# Patient Record
Sex: Female | Born: 1955 | Race: White | Hispanic: No | Smoking: Former smoker
Health system: Southern US, Community
[De-identification: ages and names within clinical notes are randomized; demographics above are authoritative.]

## PROBLEM LIST (undated history)

## (undated) DIAGNOSIS — M4802 Spinal stenosis, cervical region: Secondary | ICD-10-CM

## (undated) DIAGNOSIS — T7840XA Allergy, unspecified, initial encounter: Secondary | ICD-10-CM

## (undated) DIAGNOSIS — E039 Hypothyroidism, unspecified: Secondary | ICD-10-CM

## (undated) DIAGNOSIS — K219 Gastro-esophageal reflux disease without esophagitis: Secondary | ICD-10-CM

## (undated) DIAGNOSIS — M1990 Unspecified osteoarthritis, unspecified site: Secondary | ICD-10-CM

## (undated) HISTORY — DX: Allergy, unspecified, initial encounter: T78.40XA

## (undated) HISTORY — DX: Hypothyroidism, unspecified: E03.9

## (undated) HISTORY — DX: Spinal stenosis, cervical region: M48.02

## (undated) HISTORY — DX: Unspecified osteoarthritis, unspecified site: M19.90

## (undated) HISTORY — DX: Gastro-esophageal reflux disease without esophagitis: K21.9

---

## 2001-01-09 HISTORY — PX: OTHER SURGICAL HISTORY: SHX169

## 2001-01-09 HISTORY — PX: BUNIONECTOMY: SHX129

## 2006-07-17 ENCOUNTER — Encounter: Admission: RE | Admit: 2006-07-17 | Discharge: 2006-07-17 | Payer: Self-pay | Admitting: Allergy

## 2009-08-07 ENCOUNTER — Encounter: Admission: RE | Admit: 2009-08-07 | Discharge: 2009-08-07 | Payer: Self-pay | Admitting: Cardiovascular Disease

## 2010-10-05 ENCOUNTER — Encounter: Payer: Self-pay | Admitting: Internal Medicine

## 2010-10-13 ENCOUNTER — Encounter: Payer: Self-pay | Admitting: Internal Medicine

## 2010-10-13 ENCOUNTER — Ambulatory Visit (AMBULATORY_SURGERY_CENTER): Payer: BC Managed Care – PPO | Admitting: *Deleted

## 2010-10-13 VITALS — Ht 64.0 in | Wt 155.8 lb

## 2010-10-13 DIAGNOSIS — Z1211 Encounter for screening for malignant neoplasm of colon: Secondary | ICD-10-CM

## 2010-10-13 MED ORDER — PEG-KCL-NACL-NASULF-NA ASC-C 100 G PO SOLR: ORAL | Status: DC

## 2010-10-27 ENCOUNTER — Ambulatory Visit (AMBULATORY_SURGERY_CENTER): Payer: BC Managed Care – PPO | Admitting: Internal Medicine

## 2010-10-27 ENCOUNTER — Encounter: Payer: Self-pay | Admitting: Internal Medicine

## 2010-10-27 VITALS — BP 131/70 | HR 65 | Temp 98.2°F | Resp 20 | Ht 64.0 in | Wt 155.0 lb

## 2010-10-27 DIAGNOSIS — K589 Irritable bowel syndrome without diarrhea: Secondary | ICD-10-CM | POA: Insufficient documentation

## 2010-10-27 DIAGNOSIS — Z1211 Encounter for screening for malignant neoplasm of colon: Secondary | ICD-10-CM

## 2010-10-27 HISTORY — PX: COLONOSCOPY: SHX174

## 2010-10-27 MED ORDER — HYOSCYAMINE SULFATE 0.125 MG SL SUBL: 0.1250 mg | SUBLINGUAL_TABLET | SUBLINGUAL | Status: AC | PRN

## 2010-10-27 MED ORDER — SODIUM CHLORIDE 0.9 % IV SOLN: 500.0000 mL | INTRAVENOUS | Status: DC

## 2010-10-27 NOTE — Patient Instructions (Addendum)
The colonoscopy was ok - mild diverticulosis only. No polyps or cancer. It does sound like you may have Irritable Bowel Syndrome. I have prescribed an anti-spasmodic called hyoscyamine to help with the urgent cramps and defecation. If this works and you need refills please ask your primary care provider to refill it.  If you have persistent problems and want help, please schedule an office visit with me. Iva Boop, MD, [facility]   Irritable Bowel Syndrome Irritable Bowel Syndrome (IBS) is caused by a disturbance of normal bowel function. Other terms used are spastic colon, mucous colitis, and irritable colon. It does not require surgery, nor does it lead to cancer. There is no cure for IBS. But with proper diet, stress reduction, and medication, you will find that your problems (symptoms) will gradually disappear or improve. IBS is a common digestive disorder. It usually appears in late adolescence or early adulthood. Women develop it twice as often as men. CAUSES   After food has been digested and absorbed in the small intestine, waste material is moved into the colon (large intestine). In the colon, water and salts are absorbed from the undigested products coming from the small intestine. The remaining residue, or fecal material, is held for elimination. Under normal circumstances, gentle, rhythmic contractions on the bowel walls push the fecal material along the colon towards the rectum. In IBS, however, these contractions are irregular and poorly coordinated. The fecal material is either retained too long, resulting in constipation, or expelled too soon, producing diarrhea. SYMPTOMS   The most common symptom of IBS is pain. It is typically in the lower left side of the belly (abdomen). But it may occur anywhere in the abdomen. It can be felt as heartburn, backache, or even as a dull pain in the arms or shoulders. The pain comes from excessive bowel-muscle spasms and from the buildup of gas and fecal  material in the colon. This pain:  Can range from sharp belly (abdominal) cramps to a dull, continuous ache.     Usually worsens soon after eating.     Is typically relieved by having a bowel movement or passing gas.  Abdominal pain is usually accompanied by constipation. But it may also produce diarrhea. The diarrhea typically occurs right after a meal or upon arising in the morning. The stools are typically soft and watery. They are often flecked with secretions (mucus). Other symptoms of IBS include:  Bloating.     Loss of appetite.     Heartburn.    Feeling sick to your stomach (nausea).     Belching    Vomiting    Gas.  IBS may also cause a number of symptoms that are unrelated to the digestive system:  Fatigue.     Headaches.    Anxiety    Shortness of breath     Difficulty in concentrating.     Dizziness.  These symptoms tend to come and go. DIAGNOSIS   The symptoms of IBS closely mimic the symptoms of other, more serious digestive disorders. So your caregiver may wish to perform a variety of additional tests to exclude these disorders. He/she wants to be certain of learning what is wrong (diagnosis). The nature and purpose of each test will be explained to you. TREATMENT A number of medications are available to help correct bowel function and/or relieve bowel spasms and abdominal pain. Among the drugs available are:  Mild, non-irritating laxatives for severe constipation and to help restore normal bowel habits.  Specific anti-diarrheal medications to treat severe or prolonged diarrhea.     Anti-spasmodic agents to relieve intestinal cramps.     Your caregiver may also decide to treat you with a mild tranquilizer or sedative during unusually stressful periods in your life.  The important thing to remember is that if any drug is prescribed for you, make sure that you take it exactly as directed. Make sure that your caregiver knows how well it worked for  you. HOME CARE INSTRUCTIONS    Avoid foods that are high in fat or oils. Some examples ZOX:[id number] cream, butter, frankfurters, sausage, and other fatty meats.     Avoid foods that have a laxative effect, such as fruit, fruit juice, and dairy products.     Cut out carbonated drinks, chewing gum, and "gassy" foods, such as beans and cabbage. This may help relieve bloating and belching.     Bran taken with plenty of liquids may help relieve constipation.     Keep track of what foods seem to trigger your symptoms.     Avoid emotionally charged situations or circumstances that produce anxiety.     Start or continue exercising.     Get plenty of rest and sleep.  MAKE SURE YOU:    Understand these instructions.     Will watch your condition.     Will get help right away if you are not doing well or get worse.  Document Released: 12/26/2004 Document Revised: 09/07/2010 Document Reviewed: 08/16/2007 [facility] Patient Information 2012 [place], [place]. Discharge instructions given with verbal understanding. Handouts on diverticulosis and a high fiber diet given. Resume previous medications.

## 2010-10-28 ENCOUNTER — Telehealth: Payer: Self-pay | Admitting: *Deleted

## 2010-10-28 NOTE — Telephone Encounter (Signed)
Left message for pt. To call us if need be.

## 2013-02-18 ENCOUNTER — Other Ambulatory Visit: Payer: Self-pay

## 2013-03-04 ENCOUNTER — Other Ambulatory Visit: Payer: Self-pay

## 2013-03-04 DIAGNOSIS — Z1231 Encounter for screening mammogram for malignant neoplasm of breast: Secondary | ICD-10-CM

## 2013-03-21 ENCOUNTER — Ambulatory Visit: Payer: BC Managed Care – PPO

## 2013-04-21 ENCOUNTER — Ambulatory Visit
Admission: RE | Admit: 2013-04-21 | Discharge: 2013-04-21 | Disposition: A | Discharge status: Home / Self Care | Payer: BC Managed Care – PPO | Source: Ambulatory Visit

## 2013-04-21 DIAGNOSIS — Z1231 Encounter for screening mammogram for malignant neoplasm of breast: Secondary | ICD-10-CM

## 2013-10-03 ENCOUNTER — Ambulatory Visit (INDEPENDENT_AMBULATORY_CARE_PROVIDER_SITE_OTHER): Payer: BC Managed Care – PPO

## 2013-10-03 ENCOUNTER — Ambulatory Visit: Payer: BC Managed Care – PPO

## 2013-10-03 ENCOUNTER — Ambulatory Visit (INDEPENDENT_AMBULATORY_CARE_PROVIDER_SITE_OTHER): Payer: BC Managed Care – PPO | Admitting: Podiatry

## 2013-10-03 ENCOUNTER — Encounter: Payer: Self-pay | Admitting: Podiatry

## 2013-10-03 VITALS — BP 130/67 | HR 71 | Resp 18

## 2013-10-03 DIAGNOSIS — M722 Plantar fascial fibromatosis: Secondary | ICD-10-CM

## 2013-10-03 DIAGNOSIS — R52 Pain, unspecified: Secondary | ICD-10-CM

## 2013-10-03 DIAGNOSIS — M2040 Other hammer toe(s) (acquired), unspecified foot: Secondary | ICD-10-CM

## 2013-10-03 DIAGNOSIS — L600 Ingrowing nail: Secondary | ICD-10-CM

## 2013-10-03 NOTE — Patient Instructions (Signed)
Plantar Fasciitis (Heel Spur Syndrome) with Rehab The plantar fascia is a fibrous, ligament-like, soft-tissue structure that spans the bottom of the foot. Plantar fasciitis is a condition that causes pain in the foot due to inflammation of the tissue. SYMPTOMS   Pain and tenderness on the underneath side of the foot.  Pain that worsens with standing or walking. CAUSES  Plantar fasciitis is caused by irritation and injury to the plantar fascia on the underneath side of the foot. Common mechanisms of injury include:  Direct trauma to bottom of the foot.  Damage to a small nerve that runs under the foot where the main fascia attaches to the heel bone.  Stress placed on the plantar fascia due to bone spurs. RISK INCREASES WITH:   Activities that place stress on the plantar fascia (running, jumping, pivoting, or cutting).  Poor strength and flexibility.  Improperly fitted shoes.  Tight calf muscles.  Flat feet.  Failure to warm-up properly before activity.  Obesity. PREVENTION  Warm up and stretch properly before activity.  Allow for adequate recovery between workouts.  Maintain physical fitness:  Strength, flexibility, and endurance.  Cardiovascular fitness.  Maintain a health body weight.  Avoid stress on the plantar fascia.  Wear properly fitted shoes, including arch supports for individuals who have flat feet. PROGNOSIS  If treated properly, then the symptoms of plantar fasciitis usually resolve without surgery. However, occasionally surgery is necessary. RELATED COMPLICATIONS   Recurrent symptoms that may result in a chronic condition.  Problems of the lower back that are caused by compensating for the injury, such as limping.  Pain or weakness of the foot during push-off following surgery.  Chronic inflammation, scarring, and partial or complete fascia tear, occurring more often from repeated injections. TREATMENT  Treatment initially involves the use of  ice and medication to help reduce pain and inflammation. The use of strengthening and stretching exercises may help reduce pain with activity, especially stretches of the Achilles tendon. These exercises may be performed at home or with a therapist. Your caregiver may recommend that you use heel cups of arch supports to help reduce stress on the plantar fascia. Occasionally, corticosteroid injections are given to reduce inflammation. If symptoms persist for greater than 6 months despite non-surgical (conservative), then surgery may be recommended.  MEDICATION   If pain medication is necessary, then nonsteroidal anti-inflammatory medications, such as aspirin and ibuprofen, or other minor pain relievers, such as acetaminophen, are often recommended.  Do not take pain medication within 7 days before surgery.  Prescription pain relievers may be given if deemed necessary by your caregiver. Use only as directed and only as much as you need.  Corticosteroid injections may be given by your caregiver. These injections should be reserved for the most serious cases, because they may only be given a certain number of times. HEAT AND COLD  Cold treatment (icing) relieves pain and reduces inflammation. Cold treatment should be applied for 10 to 15 minutes every 2 to 3 hours for inflammation and pain and immediately after any activity that aggravates your symptoms. Use ice packs or massage the area with a piece of ice (ice massage).  Heat treatment may be used prior to performing the stretching and strengthening activities prescribed by your caregiver, physical therapist, or athletic trainer. Use a heat pack or soak the injury in warm water. SEEK IMMEDIATE MEDICAL CARE IF:  Treatment seems to offer no benefit, or the condition worsens.  Any medications produce adverse side effects. EXERCISES RANGE   OF MOTION (ROM) AND STRETCHING EXERCISES - Plantar Fasciitis (Heel Spur Syndrome) These exercises may help you  when beginning to rehabilitate your injury. Your symptoms may resolve with or without further involvement from your physician, physical therapist or athletic trainer. While completing these exercises, remember:   Restoring tissue flexibility helps normal motion to return to the joints. This allows healthier, less painful movement and activity.  An effective stretch should be held for at least 30 seconds.  A stretch should never be painful. You should only feel a gentle lengthening or release in the stretched tissue. RANGE OF MOTION - Toe Extension, Flexion  Sit with your right / left leg crossed over your opposite knee.  Grasp your toes and gently pull them back toward the top of your foot. You should feel a stretch on the bottom of your toes and/or foot.  Hold this stretch for __________ seconds.  Now, gently pull your toes toward the bottom of your foot. You should feel a stretch on the top of your toes and or foot.  Hold this stretch for __________ seconds. Repeat __________ times. Complete this stretch __________ times per day.  RANGE OF MOTION - Ankle Dorsiflexion, Active Assisted  Remove shoes and sit on a chair that is preferably not on a carpeted surface.  Place right / left foot under knee. Extend your opposite leg for support.  Keeping your heel down, slide your right / left foot back toward the chair until you feel a stretch at your ankle or calf. If you do not feel a stretch, slide your bottom forward to the edge of the chair, while still keeping your heel down.  Hold this stretch for __________ seconds. Repeat __________ times. Complete this stretch __________ times per day.  STRETCH - Gastroc, Standing  Place hands on wall.  Extend right / left leg, keeping the front knee somewhat bent.  Slightly point your toes inward on your back foot.  Keeping your right / left heel on the floor and your knee straight, shift your weight toward the wall, not allowing your back to  arch.  You should feel a gentle stretch in the right / left calf. Hold this position for __________ seconds. Repeat __________ times. Complete this stretch __________ times per day. STRETCH - Soleus, Standing  Place hands on wall.  Extend right / left leg, keeping the other knee somewhat bent.  Slightly point your toes inward on your back foot.  Keep your right / left heel on the floor, bend your back knee, and slightly shift your weight over the back leg so that you feel a gentle stretch deep in your back calf.  Hold this position for __________ seconds. Repeat __________ times. Complete this stretch __________ times per day. STRETCH - Gastrocsoleus, Standing  Note: This exercise can place a lot of stress on your foot and ankle. Please complete this exercise only if specifically instructed by your caregiver.   Place the ball of your right / left foot on a step, keeping your other foot firmly on the same step.  Hold on to the wall or a rail for balance.  Slowly lift your other foot, allowing your body weight to press your heel down over the edge of the step.  You should feel a stretch in your right / left calf.  Hold this position for __________ seconds.  Repeat this exercise with a slight bend in your right / left knee. Repeat __________ times. Complete this stretch __________ times per day.    STRENGTHENING EXERCISES - Plantar Fasciitis (Heel Spur Syndrome)  These exercises may help you when beginning to rehabilitate your injury. They may resolve your symptoms with or without further involvement from your physician, physical therapist or athletic trainer. While completing these exercises, remember:   Muscles can gain both the endurance and the strength needed for everyday activities through controlled exercises.  Complete these exercises as instructed by your physician, physical therapist or athletic trainer. Progress the resistance and repetitions only as guided. STRENGTH -  Towel Curls  Sit in a chair positioned on a non-carpeted surface.  Place your foot on a towel, keeping your heel on the floor.  Pull the towel toward your heel by only curling your toes. Keep your heel on the floor.  If instructed by your physician, physical therapist or athletic trainer, add ____________________ at the end of the towel. Repeat __________ times. Complete this exercise __________ times per day. STRENGTH - Ankle Inversion  Secure one end of a rubber exercise band/tubing to a fixed object (table, pole). Loop the other end around your foot just before your toes.  Place your fists between your knees. This will focus your strengthening at your ankle.  Slowly, pull your big toe up and in, making sure the band/tubing is positioned to resist the entire motion.  Hold this position for __________ seconds.  Have your muscles resist the band/tubing as it slowly pulls your foot back to the starting position. Repeat __________ times. Complete this exercises __________ times per day.  Document Released: 12/26/2004 Document Revised: 03/20/2011 Document Reviewed: 04/09/2008 [employer] Patient Information 2015 [employer]. This information is not intended to replace advice given to you by your health care provider. Make sure you discuss any questions you have with your health care provider.  

## 2013-10-03 NOTE — Progress Notes (Signed)
Subjective:    Patient ID: Lacey York, female    DOB: 1955/12/08, 58 y.o.   MRN: [id number]  HPI Lacey York, 58 year old female, presents the office today with complaints of left soft tissue mass on the bottom of her foot. She states it is his been present for over one year however her husband has noticed it getting progressively larger. She denies any pain associated with the lesion. Also states that she has bilateral heel pain which has been ongoing for several months. Denies any history of injury or trauma.  She denies any prior treatment. Also asking about a 'tigger toe" on the left 2nd digit. This is currently asymptomatic however she has has noticed some progression of the digit. Denies any trauma or injury. She is also inquiring about toenail changes. She states that her nails have started to become incurvated. She denies any redness, drainage or pain associated with the nail changes. No other complaints at this time.     Review of Systems  Constitutional: Positive for activity change.  HENT: Positive for sinus pressure.   Gastrointestinal: Positive for diarrhea and constipation.       Bloating  Genitourinary: Positive for urgency.  Musculoskeletal: Positive for back pain.       Joint pain and muscle pain  Skin:       Change in nails  Allergic/Immunologic: Positive for food allergies.       Egg   All other systems reviewed and are negative.      Objective:   Physical Exam AAO x3, NAD DP/PT pulses palpable 2/4 b/l, CRT < 3 sec. protective sensation intact with Simms Weinstein monofilament, vibratory sensation intact, Achilles tendon reflex intact. Small firm nodule on the plantar medial aspect of the left foot over the course of the medial band of the plantar fascia. There is no pain associated with the lesion and there is no overlying skin changes. Bilateral heel pain along the plantar medial tubercle of the calcaneus at the insertion of the plantar fascia. No pain  with lateral compression of the calcaneus or along the posterior aspect. No pain within the arch of the foot along the course of plantar fascia. Mild equinus bilaterally. Mild decrease in medial arch height upon weightbearing. Slight dorsal elevation and second digits bilaterally left greater than right. Slight hammertoe contractures. MMT 5/5, ROM WNL Nails incurvated along the lesser digits. There is some yellow/white discoloration to the nails (patient has some nail polish over the nails which is hard to get off and may be the cause of some of the discoloration). No tenderness associated the nails and no redness or drainage from around the nail sites. No open lesions. No calf pain, swelling, warmth.       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 See x-ray report. No acute fractures. -Conservative versus surgical treatment were discussed including alternatives, risks, complications. 1. Left foot soft tissue mass -This is likely a result of a plantar fibroma. Discussed other possibilities with the patient. Discussed that we could obtain an MRI to further evaluated however she wishes to hold off at this time. - Patient to have custom orthotics made for her due to plantar fasciitis. Will offload soft tissue mass on the left foot with the orthotic  2. Bilateral heel pain -Consistent with plantar fasciitis. Discussed various treatment options.  At this time patient wishes to hold off on any injection. We'll proceed with icing, stretching exercises which were discussed with her today, shoe gear modification/bracing. Also discussed orthotics to help control her foot type. She wishes to be scanned for custom orthotics today. If symptoms continue and next appointment  she will consider steroid injections.  3. Left 2nd digit contracture -Likely result of an early hammertoe deformity and or plantar plate attenuation. Currently no pain associated with the toes. Discussed etiology and possible progression. Continue to monitor.  4. Toenail changes -Onychocryptosis of lesser digits without any associated pain, infection. There is possibly some underlying onychomycosis however is difficult to evaluate due to remnants of nail polish still the nails. At next appointment she states that she'll have the nails cleaned off and we will further evaluate the nails. Discussed possible nail biopsy to evaluate for possible nail fungus if symptoms continue.  -Followup in 3 weeks or sooner if any problems are to arise or any change in symptoms. In the meantime call any questions, concerns, change in symptoms.

## 2013-10-24 ENCOUNTER — Encounter: Payer: Self-pay | Admitting: Podiatry

## 2013-10-24 ENCOUNTER — Ambulatory Visit (INDEPENDENT_AMBULATORY_CARE_PROVIDER_SITE_OTHER): Payer: BC Managed Care – PPO | Admitting: Podiatry

## 2013-10-24 VITALS — BP 123/75 | HR 70 | Resp 16

## 2013-10-24 DIAGNOSIS — M722 Plantar fascial fibromatosis: Secondary | ICD-10-CM

## 2013-10-24 NOTE — Patient Instructions (Signed)

## 2013-10-27 NOTE — Progress Notes (Signed)
Patient ID: Lacey ForestCynthia York, female   DOB: Oct 28, 1955, 58 y.o.   MRN: [id number]  Subjective: Lacey York presents the office today to pick up orthotics duie to bilateral plantar fasciitis and left foot plantar fibroma. She states that since last appointment she's had improvement in her plantar fasciitis symptoms. His been continuing stretching as well as icing. He denies any acute changes since last appointment. No other complaints at this time. She denies any acute changes since last appointment. No other complaints at this time.  Objective: AAO x3, NAD Neurovascular status unchanged. Patient put orthotics in her shoes which appear to fit appropriately without any high-pressure areas. She states the orthotics are comfortable.   Assessment : 58 year old female presents for orthotic pickup   Plan: -Discussed with the patient the appropriate way to wear the orthotics in the appropriate break-in period.  If the orthotics are causing high-pressure areas or uncomfortable to call the office and we can send him back for adjustments.  -Continue icing and stretching. If symptoms continue or worsen call the office. Also discussed that if the soft tissue mass in the left foot become symptomatic, increases in size, any skin changes to call the office.

## 2019-02-08 ENCOUNTER — Other Ambulatory Visit: Payer: Self-pay | Admitting: Orthopedic Surgery

## 2019-02-08 DIAGNOSIS — R609 Edema, unspecified: Secondary | ICD-10-CM

## 2019-02-08 DIAGNOSIS — R52 Pain, unspecified: Secondary | ICD-10-CM

## 2019-02-08 DIAGNOSIS — M25661 Stiffness of right knee, not elsewhere classified: Secondary | ICD-10-CM

## 2019-02-19 ENCOUNTER — Other Ambulatory Visit: Payer: Self-pay

## 2019-02-19 ENCOUNTER — Ambulatory Visit
Admission: RE | Admit: 2019-02-19 | Discharge: 2019-02-19 | Disposition: A | Discharge status: Home / Self Care | Payer: BC Managed Care – PPO | Source: Ambulatory Visit | Attending: Orthopedic Surgery | Admitting: Orthopedic Surgery

## 2019-02-19 DIAGNOSIS — R52 Pain, unspecified: Secondary | ICD-10-CM

## 2019-02-19 DIAGNOSIS — R609 Edema, unspecified: Secondary | ICD-10-CM

## 2019-02-19 DIAGNOSIS — M25661 Stiffness of right knee, not elsewhere classified: Secondary | ICD-10-CM

## 2019-02-24 ENCOUNTER — Other Ambulatory Visit: Payer: Self-pay | Admitting: Family Medicine

## 2019-02-24 DIAGNOSIS — Z1231 Encounter for screening mammogram for malignant neoplasm of breast: Secondary | ICD-10-CM

## 2019-02-28 ENCOUNTER — Ambulatory Visit: Payer: BC Managed Care – PPO

## 2019-03-31 ENCOUNTER — Ambulatory Visit
Admission: RE | Admit: 2019-03-31 | Discharge: 2019-03-31 | Disposition: A | Discharge status: Home / Self Care | Payer: BC Managed Care – PPO | Source: Ambulatory Visit | Attending: Family Medicine | Admitting: Family Medicine

## 2019-03-31 ENCOUNTER — Other Ambulatory Visit: Payer: Self-pay

## 2019-03-31 DIAGNOSIS — Z1231 Encounter for screening mammogram for malignant neoplasm of breast: Secondary | ICD-10-CM

## 2020-10-17 IMAGING — MR MR KNEE*R* W/O CM
4 of 6 series · 21 of 40 positions shown · non-contrast
Comparison: None.

CLINICAL DATA: Chronic progressive right knee pain.

EXAM:
MRI OF THE RIGHT KNEE WITHOUT CONTRAST
TECHNIQUE: Multiplanar, multisequence MR imaging of the knee was performed. No
intravenous contrast was administered.

[Series 3: T2 fat-sat · axial · 4.0mm · 0.50mm/px · z∈[-75,+20]mm · 4 of 24 slices shown (1 of 2)]
[im 1/24]
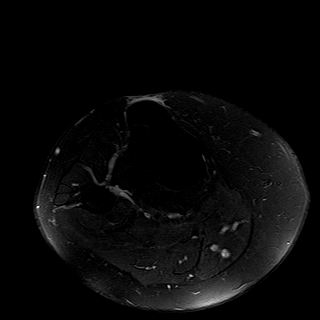
[im 4/24]
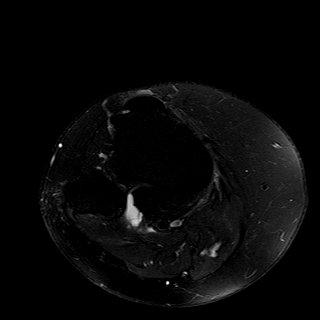
[im 12/24]
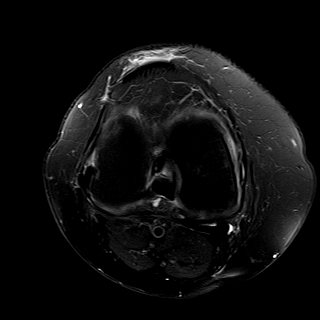
[im 20/24]
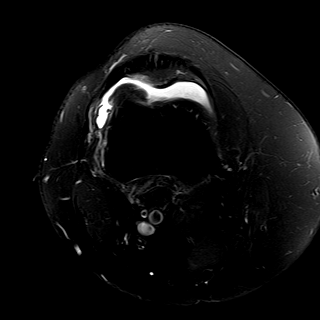

[Series 5: T2 fat-sat · coronal · 4.0mm · 0.29mm/px · 3 of 24 slices shown (2 of 2)]
[im 5/24]
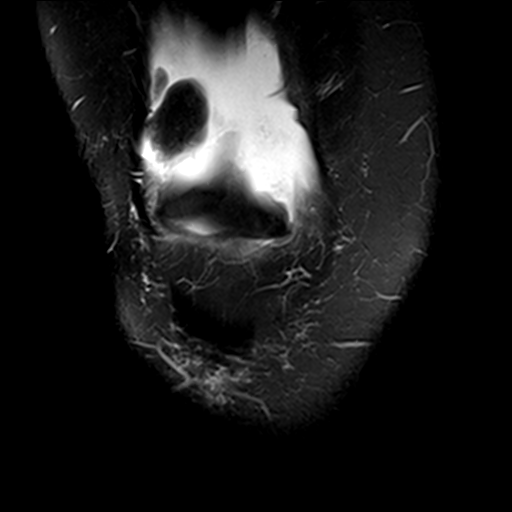
[im 14/24]
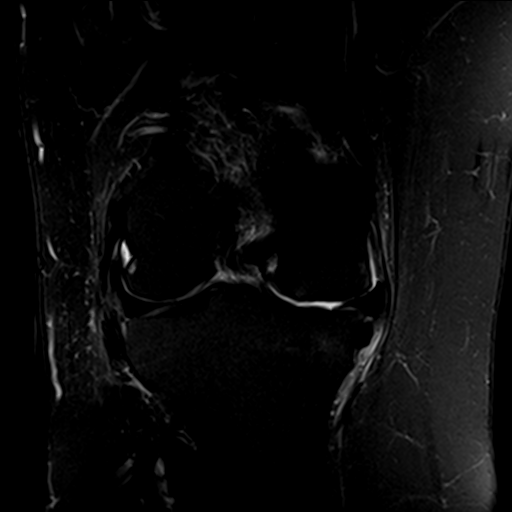
[im 24/24]
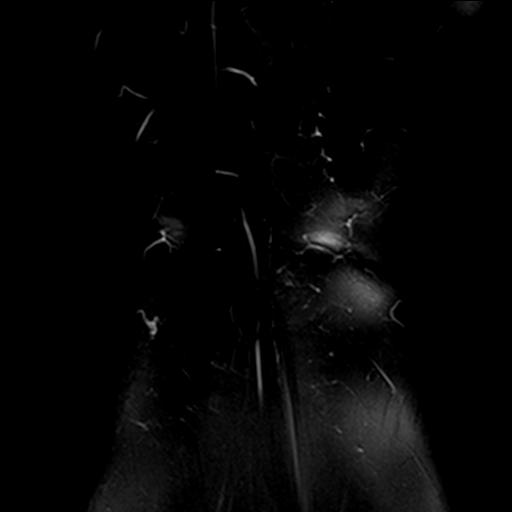

[Series 7: PD fat-sat · sagittal · 3.2mm · 0.29mm/px · 6 of 24 slices shown (1 of 2)]
[im 1/24]
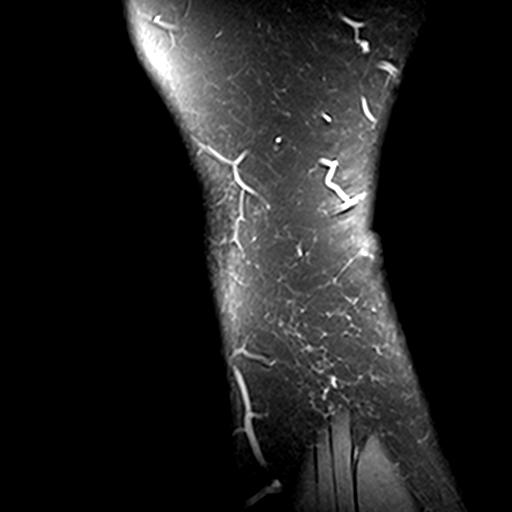
[im 5/24]
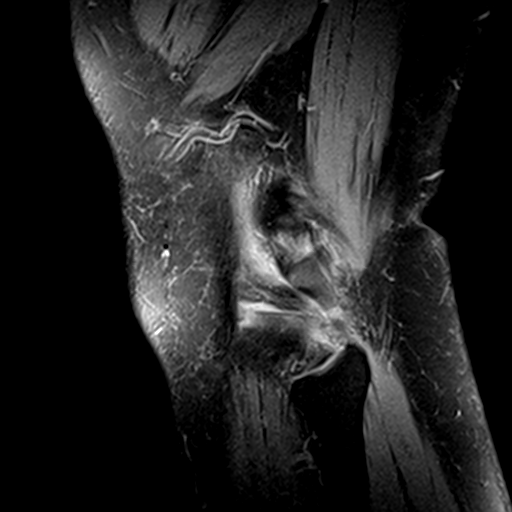
[im 10/24]
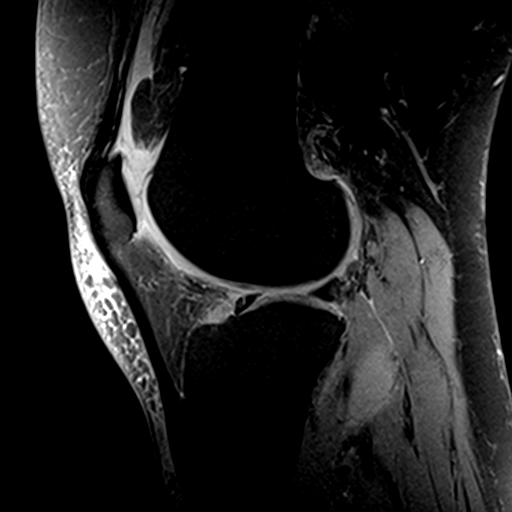
[im 14/24]
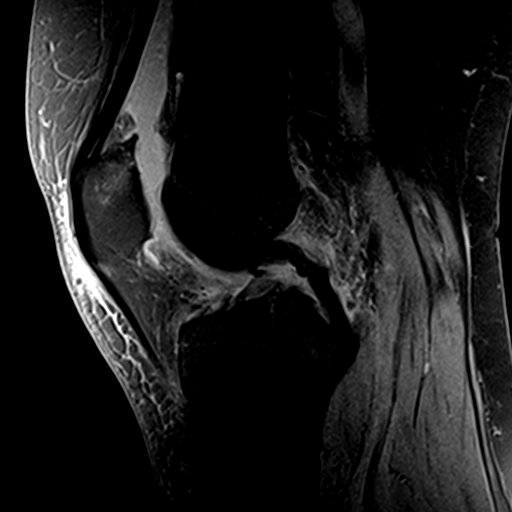
[im 19/24]
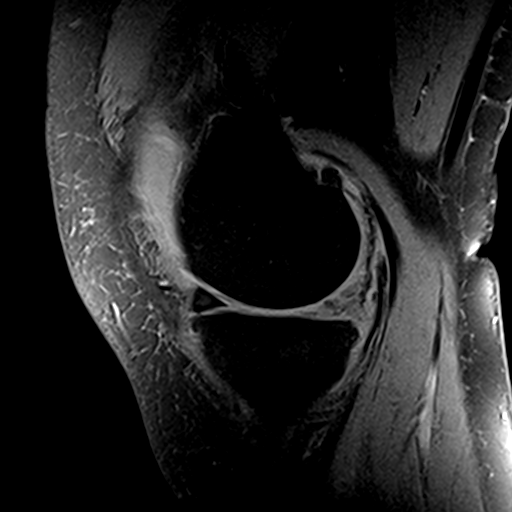
[im 24/24]
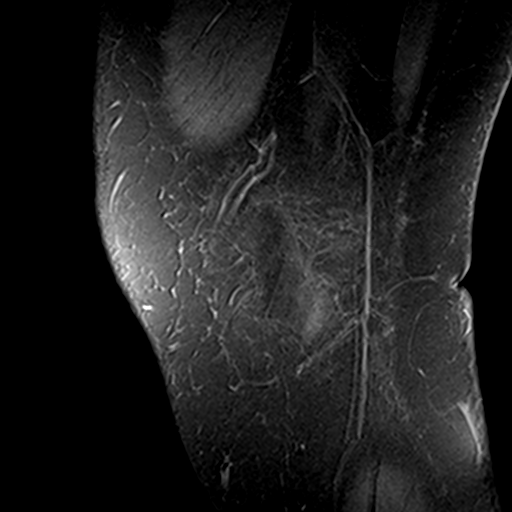

[Series 8: PD fat-sat · coronal · 3.2mm · 0.29mm/px · 8 of 30 slices shown (2 of 2)]
[im 1/30]
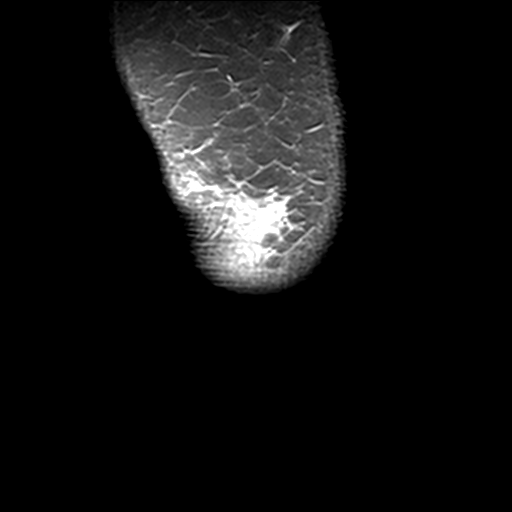
[im 5/30]
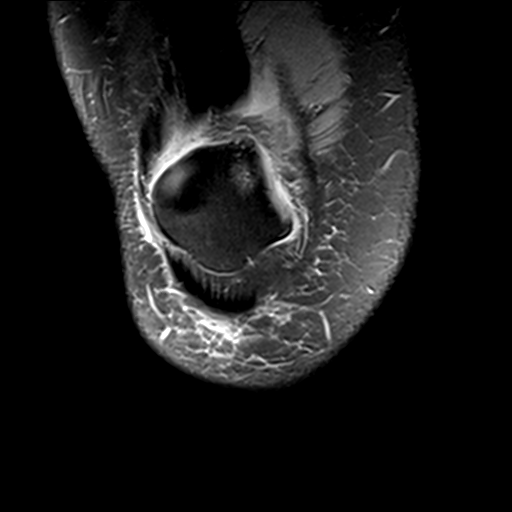
[im 9/30]
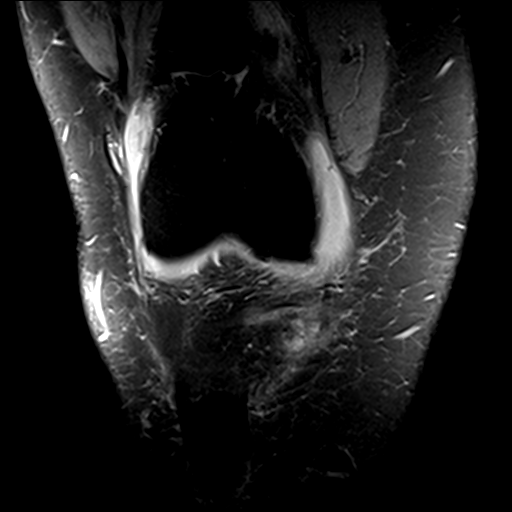
[im 13/30]
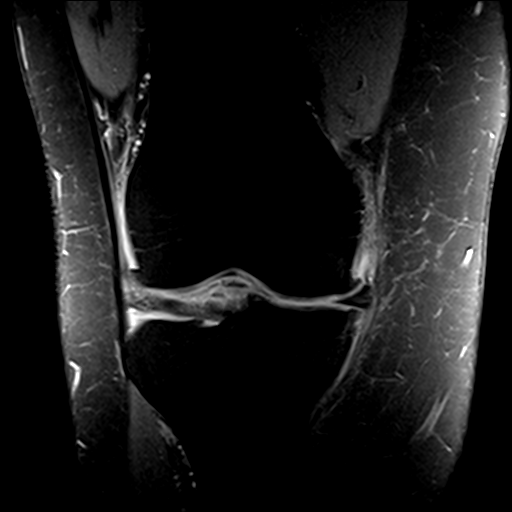
[im 17/30]
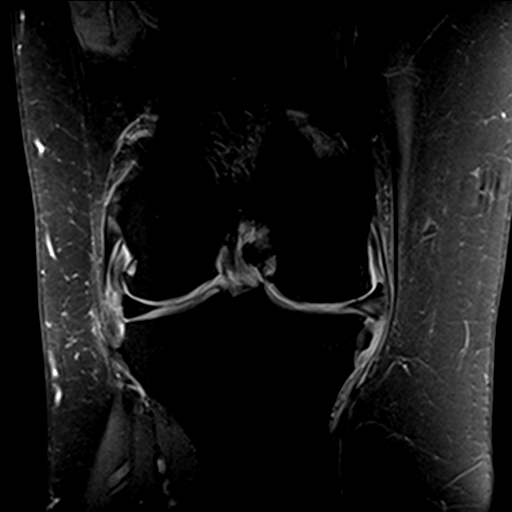
[im 21/30]
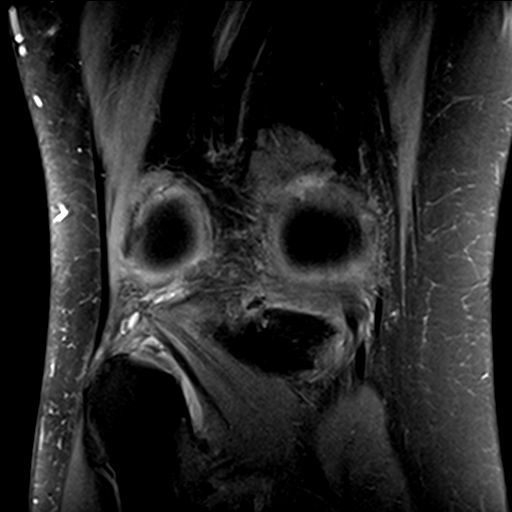
[im 25/30]
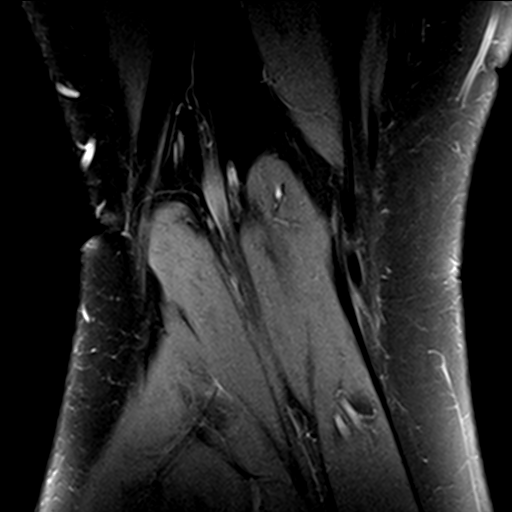
[im 30/30]
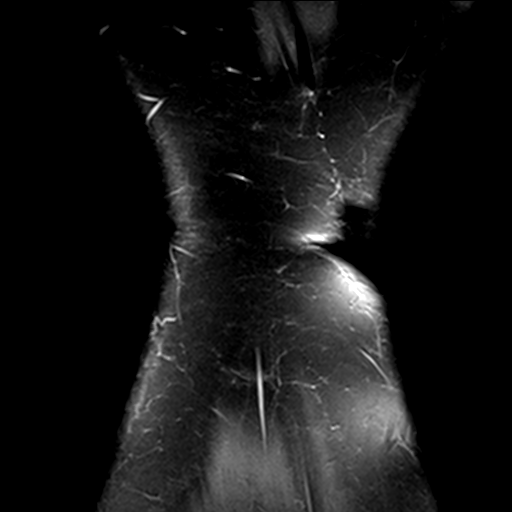

[21 of 40 positions shown; findings below may reference images not displayed]

FINDINGS: MENISCI

Medial meniscus: There is a complex tear of the posterior horn with
radial and horizontal and oblique components. The radial tear does
not appear to be complete. However, the degenerated midbody of the
meniscus is peripherally extruded from the medial compartment due to
medial joint space narrowing and the posterior tear.

Lateral meniscus:  Normal.

LIGAMENTS

Cruciates:  Intact. Cystic degenerative changes of the distal ACL.

Collaterals:  Normal.

CARTILAGE

Patellofemoral: Tiny focal area of full-thickness cartilage loss at
the superior aspect of the medial facet of the patella with
subcortical edema extending into the patella from that defect.

Medial: Diffuse marked thinning of the articular cartilage in the
central portion of the medial compartment.

Lateral:  Normal.

Joint: Moderate joint effusion. No plical thickening. Normal Hoffa's
fat pad.

Popliteal Fossa: No Baker's cyst. However, there is a small amount
of fluid extending along the pes anserine tendon and there is a
ganglion cyst extending along the popliteus tendon. Extensor
Mechanism: Normal.

Bones: Small tricompartmental marginal osteophytes, most prominent
the medial compartment.

Other: None
IMPRESSION: 1. Complex tear of the posterior horn of the medial meniscus with
secondary meniscal subluxation and osteoarthritic changes of the
medial compartment.
2. Focal full-thickness cartilage loss at the superior aspect of the
medial facet of the patella.
3. Moderate joint effusion.
4. Mild pes anserine bursitis.

## 2021-02-17 ENCOUNTER — Encounter: Payer: Self-pay | Admitting: Internal Medicine
